# Patient Record
Sex: Female | Born: 1970 | Race: White | Hispanic: No | Marital: Married | Smoking: Current every day smoker
Health system: Southern US, Community
[De-identification: ages and names within clinical notes are randomized; demographics above are authoritative.]

## PROBLEM LIST (undated history)

## (undated) DIAGNOSIS — K219 Gastro-esophageal reflux disease without esophagitis: Secondary | ICD-10-CM

## (undated) DIAGNOSIS — F329 Major depressive disorder, single episode, unspecified: Secondary | ICD-10-CM

## (undated) DIAGNOSIS — F32A Depression, unspecified: Secondary | ICD-10-CM

## (undated) HISTORY — DX: Gastro-esophageal reflux disease without esophagitis: K21.9

## (undated) HISTORY — DX: Depression, unspecified: F32.A

## (undated) HISTORY — DX: Major depressive disorder, single episode, unspecified: F32.9

## (undated) HISTORY — PX: CYST REMOVAL HAND: SHX6279

## (undated) HISTORY — PX: TONSILECTOMY, ADENOIDECTOMY, BILATERAL MYRINGOTOMY AND TUBES: SHX2538

---

## 2004-02-20 HISTORY — PX: CHOLECYSTECTOMY: SHX55

## 2006-07-20 ENCOUNTER — Emergency Department (HOSPITAL_COMMUNITY): Admission: EM | Admit: 2006-07-20 | Discharge: 2006-07-20 | Payer: Self-pay | Admitting: Family Medicine

## 2009-06-21 ENCOUNTER — Ambulatory Visit (HOSPITAL_COMMUNITY): Admission: RE | Admit: 2009-06-21 | Discharge: 2009-06-21 | Payer: Self-pay | Admitting: *Deleted

## 2011-11-13 IMAGING — US US OB DETAIL+14 WK
1 series · 18 of 28 positions shown · non-contrast
Comparison: none

OBSTETRICAL ULTRASOUND:
 This ultrasound was performed in The [HOSPITAL], and the [facility] report will be stored to [REDACTED] PACS.  This report is also available in [HOSPITAL]?s accessANYware.

[Series 1: us ob detail+14 wk · 18 of 89 slices shown]
[im 1/89]
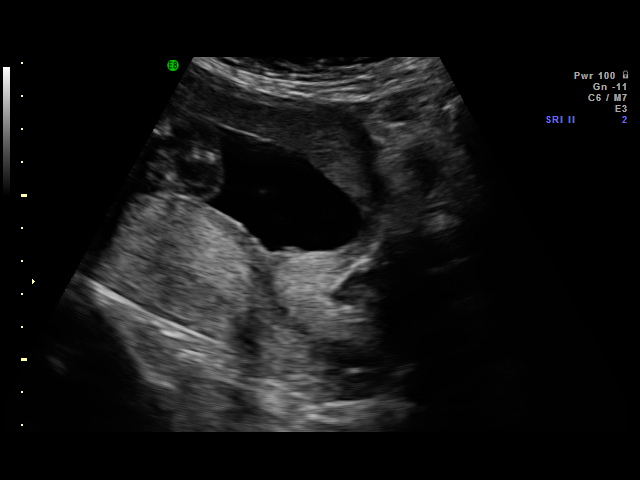
[im 7/89]
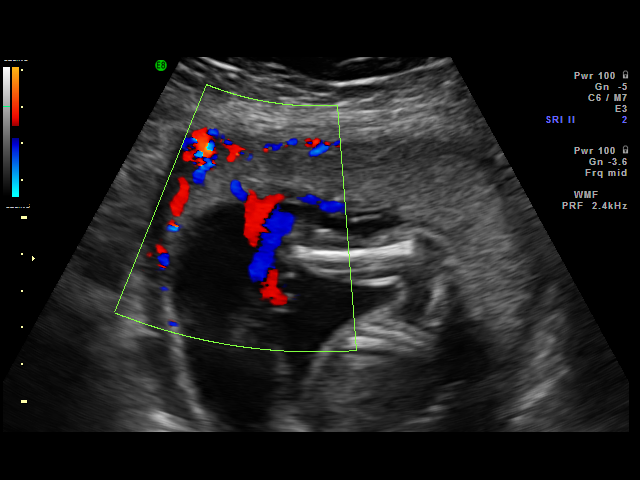
[im 10/89]
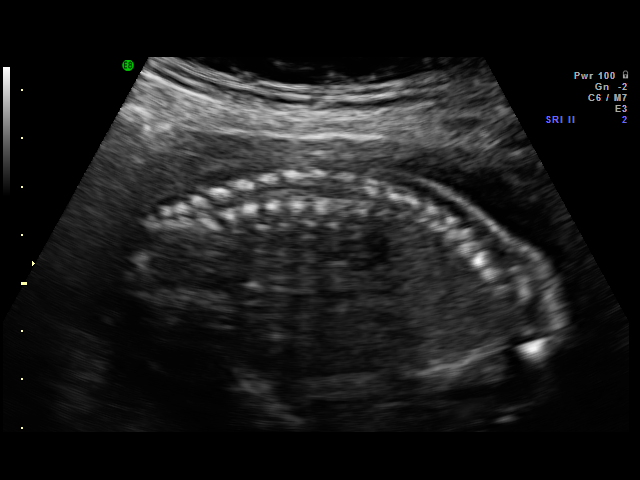
[im 17/89]
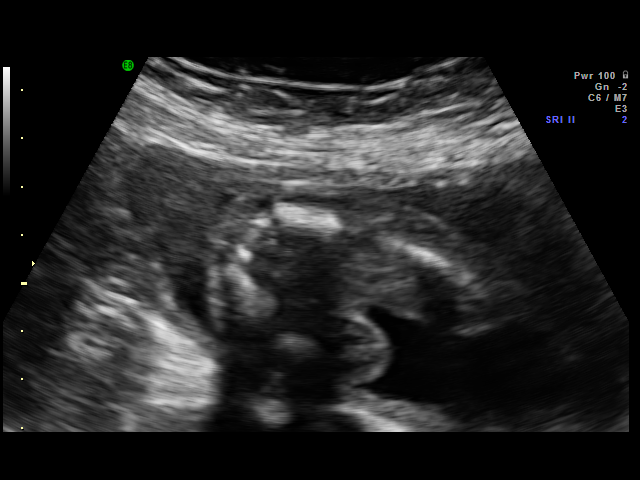
[im 23/89]
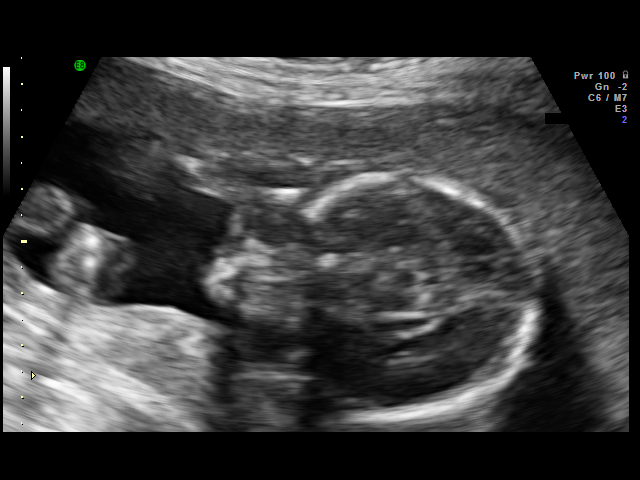
[im 27/89]
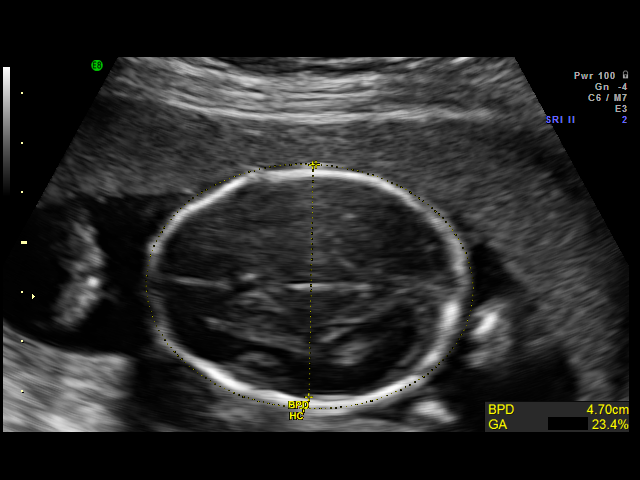
[im 33/89]
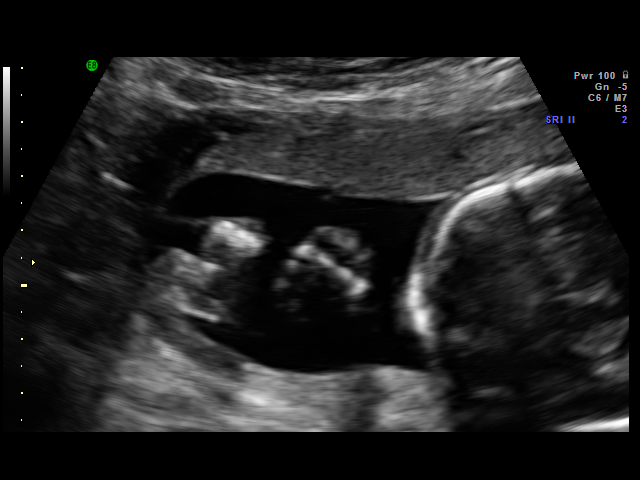
[im 36/89]
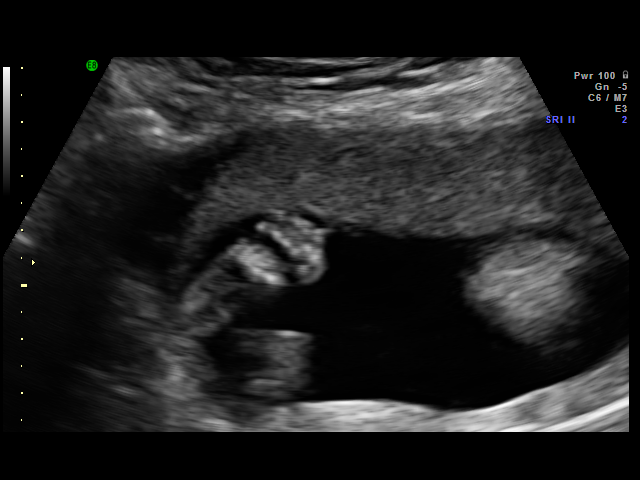
[im 43/89]
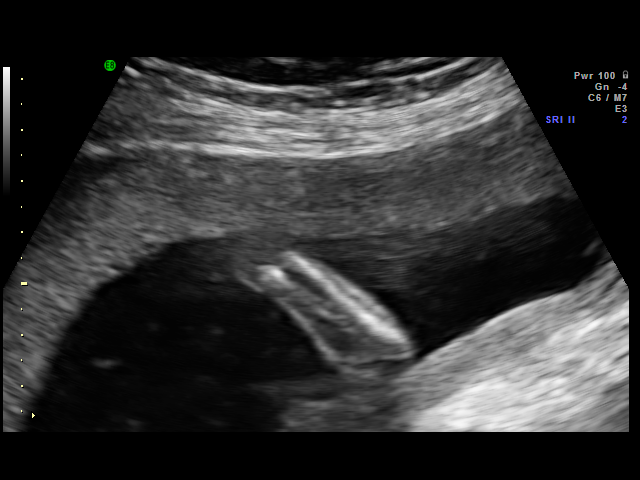
[im 46/89]
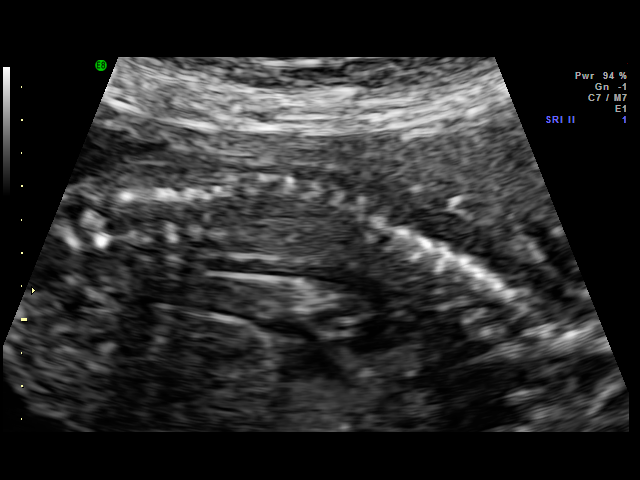
[im 53/89]
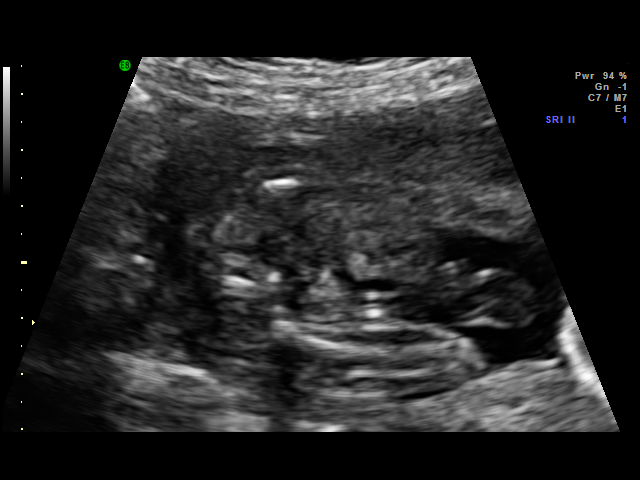
[im 56/89]
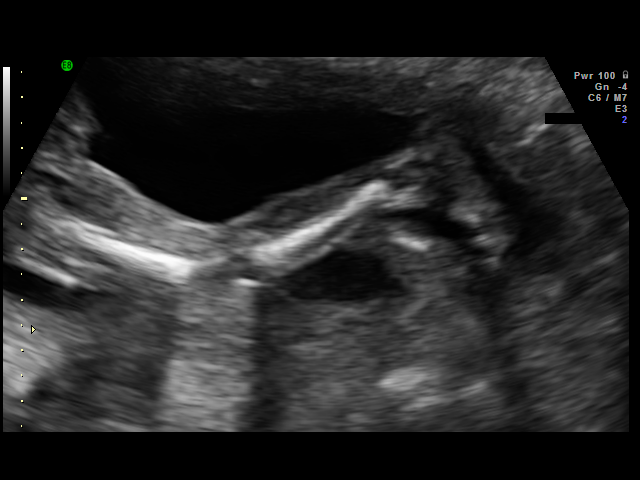
[im 62/89]
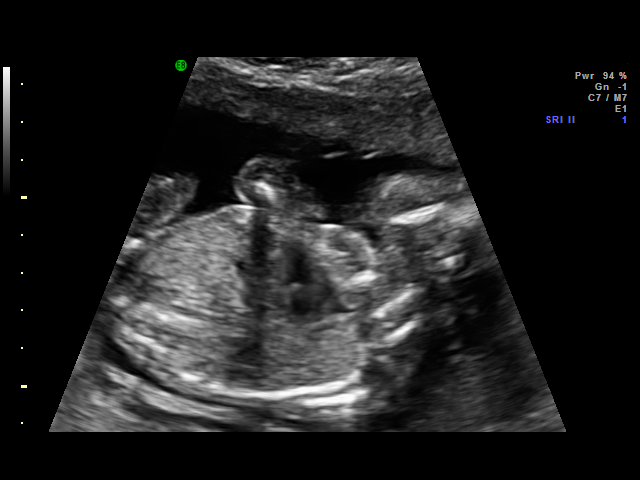
[im 69/89]
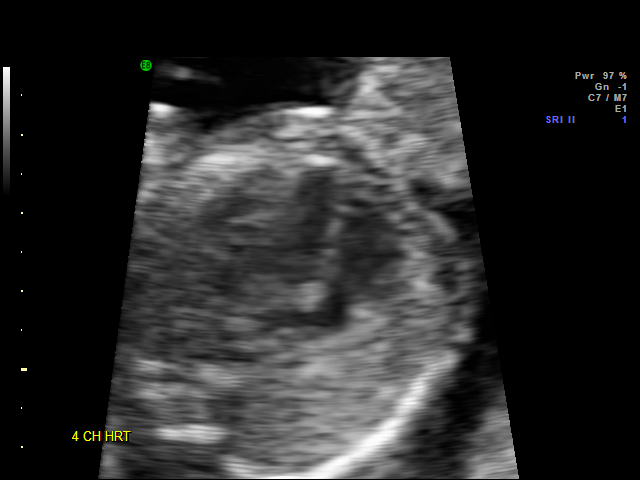
[im 72/89]
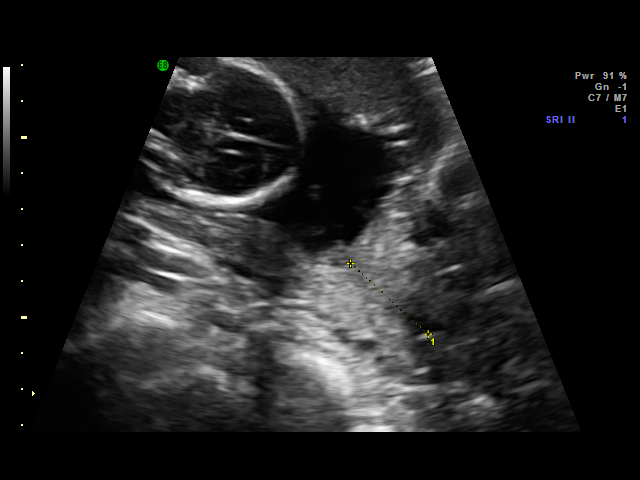
[im 79/89]
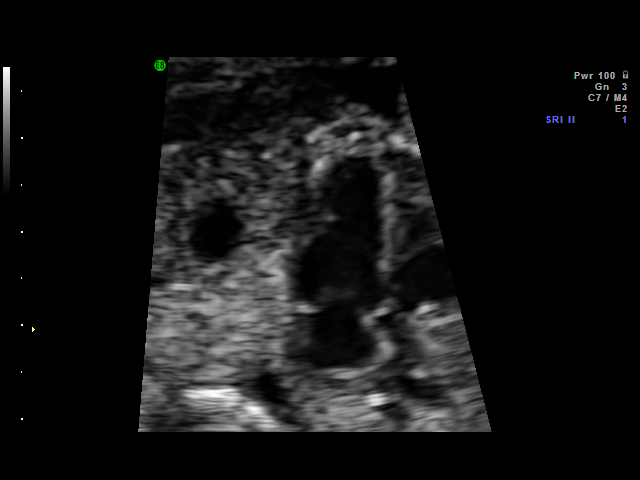
[im 82/89]
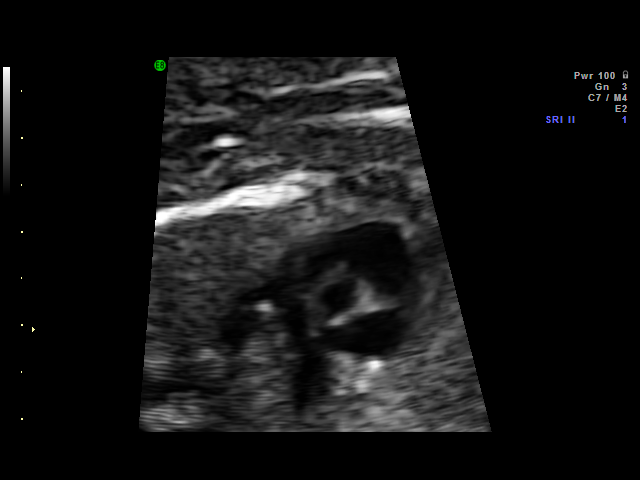
[im 89/89]
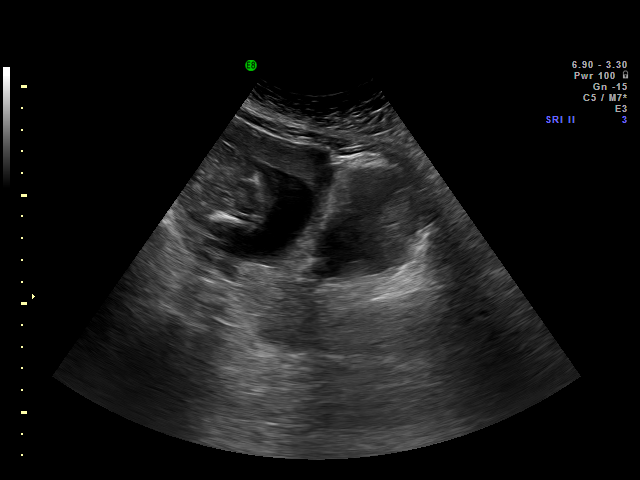

[18 of 28 positions shown; findings below may reference images not displayed]

IMPRESSION: [facility] has also been faxed to the ordering physician.

## 2014-06-07 ENCOUNTER — Telehealth: Payer: Self-pay | Admitting: Internal Medicine

## 2014-06-07 NOTE — Telephone Encounter (Signed)
Pt has OV for 5/4 at 10 with EG as a new patient coming from [place]. Referred by [facility] for abd pain with bloating, diarrhea and nausea. She mentioned about being on an antibiotic and wanted to know what she could do in the meantime. I told her since she would be a new patient to us and not ever seeing her in the office before that we could not make any recommendations, but she would still like to speak with nurse.  She asked if we were in network and I told her that we did not have her insurance card and she would need to bring that with her the day of OV. She said that she would call her insurance to verify and if there's an issue she would call back. [phone] or [phone]

## 2014-06-09 NOTE — Telephone Encounter (Signed)
I spoke with the pt, informed her right away that with her being a new pt and we didn't have any medical information about her except what the pcp sent, that there was not much I could tell her. She said she had a ct done at her pcp and was told she had colitis and given cipro and flagyl. She felt better until recently she feels like her symptoms are returning. She is having 5 watery stools daily, no fever, no blood in her stool. She did call her pcp today to see if they can give her anything to help until her ov with EG on 5/4. She has not heard from them at this time. She has not contacted her insurance yet to see if we were in network.   She wants to know if she can be seen sooner, she doesn't want to get as bad as she did before. I told her that I would check her records and speak with someone and get back with her.

## 2014-06-10 NOTE — Telephone Encounter (Signed)
We had a cancellation on the schedule. Pts appt moved to Tues 06/15/14 at 1:30pm with EG. Pt is aware. She said her pcp sent her in some more abx today.

## 2014-06-11 NOTE — Telephone Encounter (Signed)
Noted  

## 2014-06-15 ENCOUNTER — Encounter: Payer: Self-pay | Admitting: Nurse Practitioner

## 2014-06-15 ENCOUNTER — Ambulatory Visit (INDEPENDENT_AMBULATORY_CARE_PROVIDER_SITE_OTHER): Payer: 59 | Admitting: Nurse Practitioner

## 2014-06-15 VITALS — BP 143/101 | HR 85 | Temp 98.1°F | Ht 62.0 in | Wt 149.2 lb

## 2014-06-15 DIAGNOSIS — R109 Unspecified abdominal pain: Secondary | ICD-10-CM | POA: Insufficient documentation

## 2014-06-15 DIAGNOSIS — R1030 Lower abdominal pain, unspecified: Secondary | ICD-10-CM

## 2014-06-15 DIAGNOSIS — R197 Diarrhea, unspecified: Secondary | ICD-10-CM | POA: Diagnosis not present

## 2014-06-15 DIAGNOSIS — K529 Noninfective gastroenteritis and colitis, unspecified: Secondary | ICD-10-CM | POA: Diagnosis not present

## 2014-06-15 NOTE — Patient Instructions (Signed)
1. We are printing at the lab slips for your lab work and stool studies. He can have these completed a lap of your choice. 2. Return for follow-up in 6-8 weeks first to reevaluate her symptoms. 3. Start taking the daily probiotic. Consult with her pharmacist for good over-the-counter options.

## 2014-06-15 NOTE — Progress Notes (Signed)
Primary Care Physician:  Pcp Not In System Primary Gastroenterologist:  Dr. Jena Gauss  Chief Complaint  Patient presents with  . Abdominal Pain  . Nausea  . Bloated    HPI:   44 year old female referred by primary care for complaints of abdominal pain, bloating, diarrhea, and nausea. Patient last seen by PCP 05/19/2014. PCP notes were fully reviewed. Patient admitted with loose stools, abdominal pain present mostly epigastric and periumbilical with some guarding noted. Urinalysis showed highly specific gravity, moderate occult blood, and moderate bile. CBC showed normal H&H at 15.6/48.4. White blood cell count was elevated at 13.9. Kidney function normal. Was patient was referred for CT of abdomen and pelvis advised to drink clear liquids 24 hours and advance diet as tolerated. CT abdomen and pelvis completed 05/19/2014 showed a liver with low density texture compatible with fatty infiltration, and: Findings consistent with colitis which may be due to infection, inflammatory conditions, or ischemia. No evidence of pneumatosis.  Today she states she was treated with po cipro and flagyl and symptoms resolved after the first few days. However, after completing treatment her symptoms returned 3-4 days later, although not as bad as the first time. Was given a second course of cpiro and flagyl bid for 10 days and just started them about 3-4 days ago, has had some minimal improvement but states "it typically takes 5-6 days to start getting better." Currently has a bowel movement about 5 times a day. Denies any hematochezia and melena. Denies N/V. Is still having some abdominal pain which is not as bad. Denies any other upper or lower GI symptoms.  Past Medical History  Diagnosis Date  . GERD (gastroesophageal reflux disease)   . Depression     Past Surgical History  Procedure Laterality Date  . Cyst removal hand Right     wrist  . Tonsilectomy, adenoidectomy, bilateral myringotomy and tubes    .  Cholecystectomy  2006    Current Outpatient Prescriptions  Medication Sig Dispense Refill  . ciprofloxacin (CIPRO) 500 MG tablet     . FLUoxetine (PROZAC) 40 MG capsule Take 40 mg by mouth daily.    . metroNIDAZOLE (FLAGYL) 500 MG tablet      No current facility-administered medications for this visit.    Allergies as of 06/15/2014  . (No Known Allergies)    Family History  Problem Relation Age of Onset  . Heart disease Father   . Rheum arthritis Father   . Diabetes Father   . Heart disease Mother   . COPD Mother   . Hypertension Mother   . Depression Mother   . Colon cancer Neg Hx     History   Social History  . Marital Status: Married    Spouse Name: N/A  . Number of Children: N/A  . Years of Education: N/A   Occupational History  . Not on file.   Social History Main Topics  . Smoking status: Current Every Day Smoker -- 1.00 packs/day    Types: Cigarettes  . Smokeless tobacco: Never Used  . Alcohol Use: 4.2 oz/week    7 Standard drinks or equivalent per week     Comment: one drink per day  . Drug Use: No  . Sexual Activity: Not on file   Other Topics Concern  . Not on file   Social History Narrative  . No narrative on file    Review of Systems: General: Negative for anorexia, weight loss, fever, chills, fatigue, weakness. Eyes: Negative for  vision changes.  ENT: Negative for hoarseness, difficulty swallowing. CV: Negative for chest pain, angina, palpitations, peripheral edema.  Respiratory: Negative for dyspnea at rest, cough, wheezing.  GI: See history of present illness. GU:  Negative for dysuria, hematuria, urinary incontinence, urinary frequency, nocturnal urination.  MS: Negative for joint pain, low back pain.  Derm: Negative for rash or itching.  Neuro: Negative for weakness, abnormal sensation, seizure, frequent headaches, memory loss, confusion.  Psych: Negative for anxiety, depression, suicidal ideation, hallucinations.  Endo: Negative  for unusual weight change.  Heme: Negative for bruising or bleeding. Allergy: Negative for rash or hives.    Physical Exam: BP 143/101 mmHg  Pulse 85  Temp(Src) 98.1 F (36.7 C)  Ht 5\' 2"  (1.575 m)  Wt 149 lb 3.2 oz (67.677 kg)  BMI 27.28 kg/m2  LMP 05/17/2014 General:   Alert and oriented. Pleasant and cooperative. Well-nourished and well-developed.  Head:  Normocephalic and atraumatic. Eyes:  Without icterus, sclera clear and conjunctiva pink.  Ears:  Normal auditory acuity. Nose:  No deformity, discharge,  or lesions. Mouth:  No deformity or lesions, oral mucosa pink.  Neck:  Supple, without mass or thyromegaly. Lungs:  Clear to auscultation bilaterally. No wheezes, rales, or rhonchi. No distress.  Heart:  S1, S2 present without murmurs appreciated.  Abdomen:  +BS, soft, non-tender and non-distended. No HSM noted. No guarding or rebound. No masses appreciated.  Rectal:  Deferred  Msk:  Symmetrical without gross deformities. Normal posture. Pulses:  Normal pulses noted. Extremities:  Without clubbing or edema. Neurologic:  Alert and  oriented x4;  grossly normal neurologically. Skin:  Intact without significant lesions or rashes. Cervical Nodes:  No significant cervical adenopathy. Psych:  Alert and cooperative. Normal mood and affect.     06/15/2014 1:59 PM

## 2014-06-18 LAB — CLOSTRIDIUM DIFFICILE BY PCR: CDIFFPCR: NOT DETECTED

## 2014-06-18 LAB — GIARDIA ANTIGEN: GIARDIA SCREEN (EIA): NEGATIVE

## 2014-06-21 LAB — STOOL CULTURE

## 2014-06-23 ENCOUNTER — Ambulatory Visit: Payer: Self-pay | Admitting: Nurse Practitioner

## 2014-06-23 NOTE — Assessment & Plan Note (Signed)
43-year-old female presented complaining of abdominal pain, bloating, diarrhea, and nausea. Had previously seen her PCP with similar symptoms and was diagnosed with infectious colitis based on results of CT of the abdomen and pelvis, and subsequently treated with a course of Cipro and Flagyl. Patient improved symptomatically well taking antibiotics, however 3-4 days after completing her antibiotic course she began having symptoms again. The symptoms were not nearly as bad as it was the first time. She was given a second course of Cipro and Flagyl for 10 days and has been taking these for about 2-3 days at this point. She is beginning to have minimal improvement however, continues to have symptoms. Based on her presentation today we will order CBC, CMP, and stool studies to rule out C. difficile and other intestinal infection, and stool for O&P. We'll also have her start a daily probiotic to promote restoration of native gastrointestinal for a period have her follow-up in 6-8 weeks for further evaluation. 

## 2014-06-23 NOTE — Progress Notes (Signed)
No pcp

## 2014-06-23 NOTE — Assessment & Plan Note (Signed)
44 year old female presented complaining of abdominal pain, bloating, diarrhea, and nausea. Had previously seen her PCP with similar symptoms and was diagnosed with infectious colitis based on results of CT of the abdomen and pelvis, and subsequently treated with a course of Cipro and Flagyl. Patient improved symptomatically well taking antibiotics, however 3-4 days after completing her antibiotic course she began having symptoms again. The symptoms were not nearly as bad as it was the first time. She was given a second course of Cipro and Flagyl for 10 days and has been taking these for about 2-3 days at this point. She is beginning to have minimal improvement however, continues to have symptoms. Based on her presentation today we will order CBC, CMP, and stool studies to rule out C. difficile and other intestinal infection, and stool for O&P. We'll also have her start a daily probiotic to promote restoration of native gastrointestinal for a period have her follow-up in 6-8 weeks for further evaluation.

## 2014-08-03 ENCOUNTER — Ambulatory Visit: Payer: 59 | Admitting: Nurse Practitioner

## 2014-08-03 ENCOUNTER — Telehealth: Payer: Self-pay | Admitting: Nurse Practitioner

## 2014-08-03 NOTE — Telephone Encounter (Signed)
Pt was a no show

## 2014-08-03 NOTE — Telephone Encounter (Signed)
Noted  

## 2014-08-19 ENCOUNTER — Ambulatory Visit: Payer: 59 | Admitting: Nurse Practitioner
